# Patient Record
Sex: Male | Born: 1980 | Race: Black or African American | Hispanic: No | Marital: Single | Smoking: Current every day smoker
Health system: Southern US, Community
[De-identification: ages and names within clinical notes are randomized; demographics above are authoritative.]

## PROBLEM LIST (undated history)

## (undated) DIAGNOSIS — Z8619 Personal history of other infectious and parasitic diseases: Secondary | ICD-10-CM

## (undated) HISTORY — DX: Personal history of other infectious and parasitic diseases: Z86.19

---
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
## 2000-08-03 ENCOUNTER — Emergency Department (HOSPITAL_COMMUNITY): Admission: EM | Admit: 2000-08-03 | Discharge: 2000-08-03 | Payer: Self-pay | Admitting: Emergency Medicine

## 2001-03-20 ENCOUNTER — Emergency Department (HOSPITAL_COMMUNITY): Admission: EM | Admit: 2001-03-20 | Discharge: 2001-03-20 | Payer: Self-pay | Admitting: Emergency Medicine

## 2002-07-03 ENCOUNTER — Emergency Department (HOSPITAL_COMMUNITY): Admission: EM | Admit: 2002-07-03 | Discharge: 2002-07-03 | Payer: Self-pay | Admitting: Emergency Medicine

## 2002-07-16 ENCOUNTER — Emergency Department (HOSPITAL_COMMUNITY): Admission: EM | Admit: 2002-07-16 | Discharge: 2002-07-16 | Payer: Self-pay | Admitting: Emergency Medicine

## 2002-07-20 ENCOUNTER — Emergency Department (HOSPITAL_COMMUNITY): Admission: EM | Admit: 2002-07-20 | Discharge: 2002-07-20 | Payer: Self-pay | Admitting: *Deleted

## 2004-07-17 ENCOUNTER — Emergency Department (HOSPITAL_COMMUNITY): Admission: EM | Admit: 2004-07-17 | Discharge: 2004-07-17 | Payer: Self-pay | Admitting: Emergency Medicine

## 2008-05-10 ENCOUNTER — Emergency Department (HOSPITAL_COMMUNITY): Admission: EM | Admit: 2008-05-10 | Discharge: 2008-05-10 | Payer: Self-pay | Admitting: Emergency Medicine

## 2009-03-10 ENCOUNTER — Emergency Department (HOSPITAL_COMMUNITY): Admission: EM | Admit: 2009-03-10 | Discharge: 2009-03-10 | Payer: Self-pay | Admitting: Emergency Medicine

## 2009-07-19 ENCOUNTER — Encounter (INDEPENDENT_AMBULATORY_CARE_PROVIDER_SITE_OTHER): Payer: Self-pay | Admitting: *Deleted

## 2009-08-02 ENCOUNTER — Ambulatory Visit: Payer: Self-pay | Admitting: Internal Medicine

## 2009-08-02 DIAGNOSIS — M545 Low back pain: Secondary | ICD-10-CM

## 2009-08-02 DIAGNOSIS — N644 Mastodynia: Secondary | ICD-10-CM

## 2009-08-02 DIAGNOSIS — B350 Tinea barbae and tinea capitis: Secondary | ICD-10-CM

## 2009-08-07 LAB — CONVERTED CEMR LAB
ALT: 23 units/L (ref 0–53)
AST: 23 units/L (ref 0–37)
Albumin: 3.8 g/dL (ref 3.5–5.2)
Alkaline Phosphatase: 83 units/L (ref 39–117)
BUN: 12 mg/dL (ref 6–23)
Basophils Absolute: 0 10*3/uL (ref 0.0–0.1)
Basophils Relative: 0.1 % (ref 0.0–3.0)
Bilirubin, Direct: 0.2 mg/dL (ref 0.0–0.3)
CO2: 28 meq/L (ref 19–32)
Calcium: 9.3 mg/dL (ref 8.4–10.5)
Chloride: 104 meq/L (ref 96–112)
Creatinine, Ser: 1 mg/dL (ref 0.4–1.5)
Eosinophils Absolute: 0.2 10*3/uL (ref 0.0–0.7)
Eosinophils Relative: 2.9 % (ref 0.0–5.0)
GFR calc non Af Amer: 113.86 mL/min (ref >60)
Glucose, Bld: 71 mg/dL (ref 70–99)
HCT: 45 % (ref 39.0–52.0)
Hemoglobin: 15 g/dL (ref 13.0–17.0)
Lymphocytes Relative: 26.8 % (ref 12.0–46.0)
Lymphs Abs: 1.9 10*3/uL (ref 0.7–4.0)
MCHC: 33.4 g/dL (ref 30.0–36.0)
MCV: 89.1 fL (ref 78.0–100.0)
Monocytes Absolute: 0.7 10*3/uL (ref 0.1–1.0)
Monocytes Relative: 10.3 % (ref 3.0–12.0)
Neutro Abs: 4.4 10*3/uL (ref 1.4–7.7)
Neutrophils Relative %: 59.9 % (ref 43.0–77.0)
Platelets: 257 10*3/uL (ref 150.0–400.0)
Potassium: 4.1 meq/L (ref 3.5–5.1)
RBC: 5.05 M/uL (ref 4.22–5.81)
RDW: 12.6 % (ref 11.5–14.6)
Sodium: 138 meq/L (ref 135–145)
TSH: 1.62 microintl units/mL (ref 0.35–5.50)
Total Bilirubin: 1.5 mg/dL — ABNORMAL HIGH (ref 0.3–1.2)
Total Protein: 7.9 g/dL (ref 6.0–8.3)
WBC: 7.2 10*3/uL (ref 4.5–10.5)

## 2009-08-08 ENCOUNTER — Encounter (INDEPENDENT_AMBULATORY_CARE_PROVIDER_SITE_OTHER): Payer: Self-pay | Admitting: *Deleted

## 2010-04-12 ENCOUNTER — Ambulatory Visit: Payer: Self-pay | Admitting: Diagnostic Radiology

## 2010-04-12 ENCOUNTER — Emergency Department (HOSPITAL_BASED_OUTPATIENT_CLINIC_OR_DEPARTMENT_OTHER): Admission: EM | Admit: 2010-04-12 | Discharge: 2010-04-12 | Payer: Self-pay | Admitting: Emergency Medicine

## 2011-03-05 ENCOUNTER — Emergency Department (HOSPITAL_COMMUNITY)
Admission: EM | Admit: 2011-03-05 | Discharge: 2011-03-06 | Disposition: A | Discharge status: Home / Self Care | Payer: Self-pay | Attending: Emergency Medicine | Admitting: Emergency Medicine

## 2011-03-05 DIAGNOSIS — R079 Chest pain, unspecified: Secondary | ICD-10-CM | POA: Insufficient documentation

## 2011-03-05 DIAGNOSIS — E860 Dehydration: Secondary | ICD-10-CM | POA: Insufficient documentation

## 2011-03-05 DIAGNOSIS — R1110 Vomiting, unspecified: Secondary | ICD-10-CM | POA: Insufficient documentation

## 2011-03-06 ENCOUNTER — Emergency Department (HOSPITAL_COMMUNITY): Payer: Self-pay

## 2011-03-06 LAB — POCT I-STAT, CHEM 8
BUN: 5 mg/dL — ABNORMAL LOW (ref 6–23)
Calcium, Ion: 1.16 mmol/L (ref 1.12–1.32)
Chloride: 103 mEq/L (ref 96–112)
Creatinine, Ser: 1.3 mg/dL (ref 0.4–1.5)
Glucose, Bld: 108 mg/dL — ABNORMAL HIGH (ref 70–99)
HCT: 49 % (ref 39.0–52.0)
Hemoglobin: 16.7 g/dL (ref 13.0–17.0)
Potassium: 3.3 mEq/L — ABNORMAL LOW (ref 3.5–5.1)
Sodium: 143 mEq/L (ref 135–145)
TCO2: 27 mmol/L (ref 0–100)

## 2011-10-19 ENCOUNTER — Ambulatory Visit (INDEPENDENT_AMBULATORY_CARE_PROVIDER_SITE_OTHER)
Admission: RE | Admit: 2011-10-19 | Discharge: 2011-10-19 | Disposition: A | Discharge status: Home / Self Care | Payer: Managed Care, Other (non HMO) | Source: Ambulatory Visit | Attending: Internal Medicine | Admitting: Internal Medicine

## 2011-10-19 ENCOUNTER — Ambulatory Visit (INDEPENDENT_AMBULATORY_CARE_PROVIDER_SITE_OTHER): Payer: Managed Care, Other (non HMO) | Admitting: Internal Medicine

## 2011-10-19 ENCOUNTER — Encounter: Payer: Self-pay | Admitting: Internal Medicine

## 2011-10-19 DIAGNOSIS — M545 Low back pain, unspecified: Secondary | ICD-10-CM

## 2011-10-19 DIAGNOSIS — M722 Plantar fascial fibromatosis: Secondary | ICD-10-CM | POA: Insufficient documentation

## 2011-10-19 MED ORDER — NAPROXEN 500 MG PO TABS: 500.0000 mg | ORAL_TABLET | Freq: Two times a day (BID) | ORAL | Status: AC

## 2011-10-19 NOTE — Assessment & Plan Note (Signed)
Start PT and try nsaids as well as pt ed material

## 2011-10-19 NOTE — Assessment & Plan Note (Addendum)
I will check a plain film today to look for any deformities, also I have asked him to start nsaids for pain relief and to start PT as well

## 2011-10-19 NOTE — Patient Instructions (Signed)
Plantar Fasciitis Plantar fasciitis is a common condition that causes foot pain. It is soreness (inflammation) of the band of tough fibrous tissue on the bottom of the foot that runs from the heel bone (calcaneus) to the ball of the foot. The cause of this soreness may be from excessive standing, poor fitting shoes, running on hard surfaces, being overweight, having an abnormal walk, or overuse (this is common in runners) of the painful foot or feet. It is also common in aerobic exercise dancers and ballet dancers. SYMPTOMS  Most people with plantar fasciitis complain of:  Severe pain in the morning on the bottom of their foot especially when taking the first steps out of bed. This pain recedes after a few minutes of walking.   Severe pain is experienced also during walking following a long period of inactivity.   Pain is worse when walking barefoot or up stairs  DIAGNOSIS   Your caregiver will diagnose this condition by examining and feeling your foot.   Special tests such as X-rays of your foot, are usually not needed.  PREVENTION   Consult a sports medicine professional before beginning a new exercise program.   Walking programs offer a good workout. With walking there is a lower chance of overuse injuries common to runners. There is less impact and less jarring of the joints.   Begin all new exercise programs slowly. If problems or pain develop, decrease the amount of time or distance until you are at a comfortable level.   Wear good shoes and replace them regularly.   Stretch your foot and the heel cords at the back of the ankle (Achilles tendon) both before and after exercise.   Run or exercise on even surfaces that are not hard. For example, asphalt is better than pavement.   Do not run barefoot on hard surfaces.   If using a treadmill, vary the incline.   Do not continue to workout if you have foot or joint problems. Seek professional help if they do not improve.  HOME CARE  INSTRUCTIONS   Avoid activities that cause you pain until you recover.   Use ice or cold packs on the problem or painful areas after working out.   Only take over-the-counter or prescription medicines for pain, discomfort, or fever as directed by your caregiver.   Soft shoe inserts or athletic shoes with air or gel sole cushions may be helpful.   If problems continue or become more severe, consult a sports medicine caregiver or your own health care provider. Cortisone is a potent anti-inflammatory medication that may be injected into the painful area. You can discuss this treatment with your caregiver.  MAKE SURE YOU:   Understand these instructions.   Will watch your condition.   Will get help right away if you are not doing well or get worse.  Document Released: 07/31/2001 Document Revised: 07/18/2011 Document Reviewed: 09/29/2008 [facility] Patient Information 2012 [place], [place].Back Pain, Adult Low back pain is very common. About 1 in 5 people have back pain.The cause of low back pain is rarely dangerous. The pain often gets better over time.About half of people with a sudden onset of back pain feel better in just 2 weeks. About 8 in 10 people feel better by 6 weeks.  CAUSES Some common causes of back pain include:  Strain of the muscles or ligaments supporting the spine.   Wear and tear (degeneration) of the spinal discs.   Arthritis.   Direct injury to the back.  DIAGNOSIS Most of the time, the direct cause of low back pain is not known.However, back pain can be treated effectively even when the exact cause of the pain is unknown.Answering your caregiver's questions about your overall health and symptoms is one of the most accurate ways to make sure the cause of your pain is not dangerous. If your caregiver needs more information, he or she may order lab work or imaging tests (X-rays or MRIs).However, even if imaging tests show changes in your back, this usually does not  require surgery. HOME CARE INSTRUCTIONS For many people, back pain returns.Since low back pain is rarely dangerous, it is often a condition that people can learn to [facility] their own.   Remain active. It is stressful on the back to sit or stand in one place. Do not sit, drive, or stand in one place for more than 30 minutes at a time. Take short walks on level surfaces as soon as pain allows.Try to increase the length of time you walk each day.   Do not stay in bed.Resting more than 1 or 2 days can delay your recovery.   Do not avoid exercise or work.Your body is made to move.It is not dangerous to be active, even though your back may hurt.Your back will likely heal faster if you return to being active before your pain is gone.   Pay attention to your body when you bend and lift. Many people have less discomfortwhen lifting if they bend their knees, keep the load close to their bodies,and avoid twisting. Often, the most comfortable positions are those that put less stress on your recovering back.   Find a comfortable position to sleep. Use a firm mattress and lie on your side with your knees slightly bent. If you lie on your back, put a pillow under your knees.   Only take over-the-counter or prescription medicines as directed by your caregiver. Over-the-counter medicines to reduce pain and inflammation are often the most helpful.Your caregiver may prescribe muscle relaxant drugs.These medicines help dull your pain so you can more quickly return to your normal activities and healthy exercise.   Put ice on the injured area.   Put ice in a plastic bag.   Place a towel between your skin and the bag.   Leave the ice on for 15 to 20 minutes, 3 to 4 times a day for the first 2 to 3 days. After that, ice and heat may be alternated to reduce pain and spasms.   Ask your caregiver about trying back exercises and gentle massage. This may be of some benefit.   Avoid feeling anxious or  stressed.Stress increases muscle tension and can worsen back pain.It is important to recognize when you are anxious or stressed and learn ways to manage it.Exercise is a great option.  SEEK MEDICAL CARE IF:  You have pain that is not relieved with rest or medicine.   You have pain that does not improve in 1 week.   You have new symptoms.   You are generally not feeling well.  SEEK IMMEDIATE MEDICAL CARE IF:   You have pain that radiates from your back into your legs.   You develop new bowel or bladder control problems.   You have unusual weakness or numbness in your arms or legs.   You develop nausea or vomiting.   You develop abdominal pain.   You feel faint.  Document Released: 11/05/2005 Document Revised: 07/18/2011 Document Reviewed: 03/26/2011 [facility] Patient Information 2012 [place],  [employer]. 

## 2011-10-19 NOTE — Progress Notes (Signed)
Subjective:    Patient ID: Jesus Freeman, male    DOB: 04/14/1981, 30 y.o.   MRN: [id number]  Back Pain This is a chronic problem. The current episode started more than 1 year ago. The problem occurs intermittently. The problem is unchanged. The pain is present in the lumbar spine. The quality of the pain is described as aching. The pain does not radiate. The pain is at a severity of 2/10. The pain is mild. The pain is worse during the day. The symptoms are aggravated by bending and twisting. Stiffness is present in the morning. Pertinent negatives include no abdominal pain, bladder incontinence, bowel incontinence, chest pain, dysuria, fever, headaches, leg pain, numbness, paresis, paresthesias, pelvic pain, perianal numbness, tingling, weakness or weight loss. Risk factors include lack of exercise. He has tried nothing for the symptoms.      Review of Systems  Constitutional: Negative for fever, chills, weight loss, diaphoresis, activity change, appetite change, fatigue and unexpected weight change.  HENT: Negative.   Eyes: Negative.   Respiratory: Negative for cough, shortness of breath, wheezing and stridor.   Cardiovascular: Negative for chest pain, palpitations and leg swelling.  Gastrointestinal: Negative for abdominal pain and bowel incontinence.  Genitourinary: Negative for bladder incontinence, dysuria, urgency, frequency, hematuria, flank pain, decreased urine volume, enuresis, difficulty urinating and pelvic pain.  Musculoskeletal: Positive for back pain and arthralgias (bilateral foot pain). Negative for myalgias, joint swelling and gait problem.  Skin: Negative for color change, pallor, rash and wound.  Neurological: Negative for dizziness, tingling, tremors, seizures, syncope, facial asymmetry, speech difficulty, weakness, light-headedness, numbness, headaches and paresthesias.  Hematological: Negative for adenopathy. Does not bruise/bleed easily.  Psychiatric/Behavioral:  Negative.        Objective:   Physical Exam  Vitals reviewed. Constitutional: He appears well-developed. No distress.  HENT:  Head: Normocephalic and atraumatic.  Mouth/Throat: Oropharynx is clear and moist. No oropharyngeal exudate.  Eyes: Conjunctivae are normal. Right eye exhibits no discharge. Left eye exhibits no discharge. No scleral icterus.  Neck: Normal range of motion. Neck supple. No JVD present. No tracheal deviation present. No thyromegaly present.  Cardiovascular: Normal rate, regular rhythm, normal heart sounds and intact distal pulses.  Exam reveals no gallop and no friction rub.   No murmur heard. Pulmonary/Chest: Effort normal and breath sounds normal. No stridor. No respiratory distress. He has no wheezes. He has no rales. He exhibits no tenderness.  Abdominal: Soft. Bowel sounds are normal. He exhibits no distension and no mass. There is no tenderness. There is no rebound and no guarding.  Musculoskeletal: Normal range of motion.       Lumbar back: He exhibits normal range of motion, no tenderness, no bony tenderness, no swelling, no edema, no deformity, no laceration, no pain and no spasm.       Right foot: He exhibits deformity (severe flat footedness). He exhibits normal range of motion, no tenderness, no bony tenderness, no swelling, normal capillary refill, no crepitus and no laceration.       Left foot: He exhibits deformity (severe flat footedness). He exhibits normal range of motion, no tenderness, no bony tenderness, no swelling, normal capillary refill, no crepitus and no laceration.       - SLR in both legs  Lymphadenopathy:    He has no cervical adenopathy.  Neurological: He is alert. He has normal strength. He displays no atrophy, no tremor and normal reflexes. No cranial nerve deficit or sensory deficit. He exhibits normal muscle tone. He  displays a negative Romberg sign. Coordination and gait normal. He displays no Babinski's sign on the right side. He  displays no Babinski's sign on the left side.  Reflex Scores:      Tricep reflexes are 1+ on the right side and 1+ on the left side.      Bicep reflexes are 1+ on the right side and 1+ on the left side.      Brachioradialis reflexes are 1+ on the right side and 1+ on the left side.      Patellar reflexes are 1+ on the right side and 1+ on the left side.      Achilles reflexes are 1+ on the left side. Skin: Skin is warm and dry. No rash noted. He is not diaphoretic. No erythema. No pallor.  Psychiatric: He has a normal mood and affect. His behavior is normal. Judgment and thought content normal.          Assessment & Plan:

## 2012-03-19 IMAGING — CR DG CHEST 2V
2 series · 2 of 2 positions shown · non-contrast
Comparison: None.

CLINICAL DATA: Vomiting and mid chest pain.

CHEST - 2 VIEW

[w chest pa]
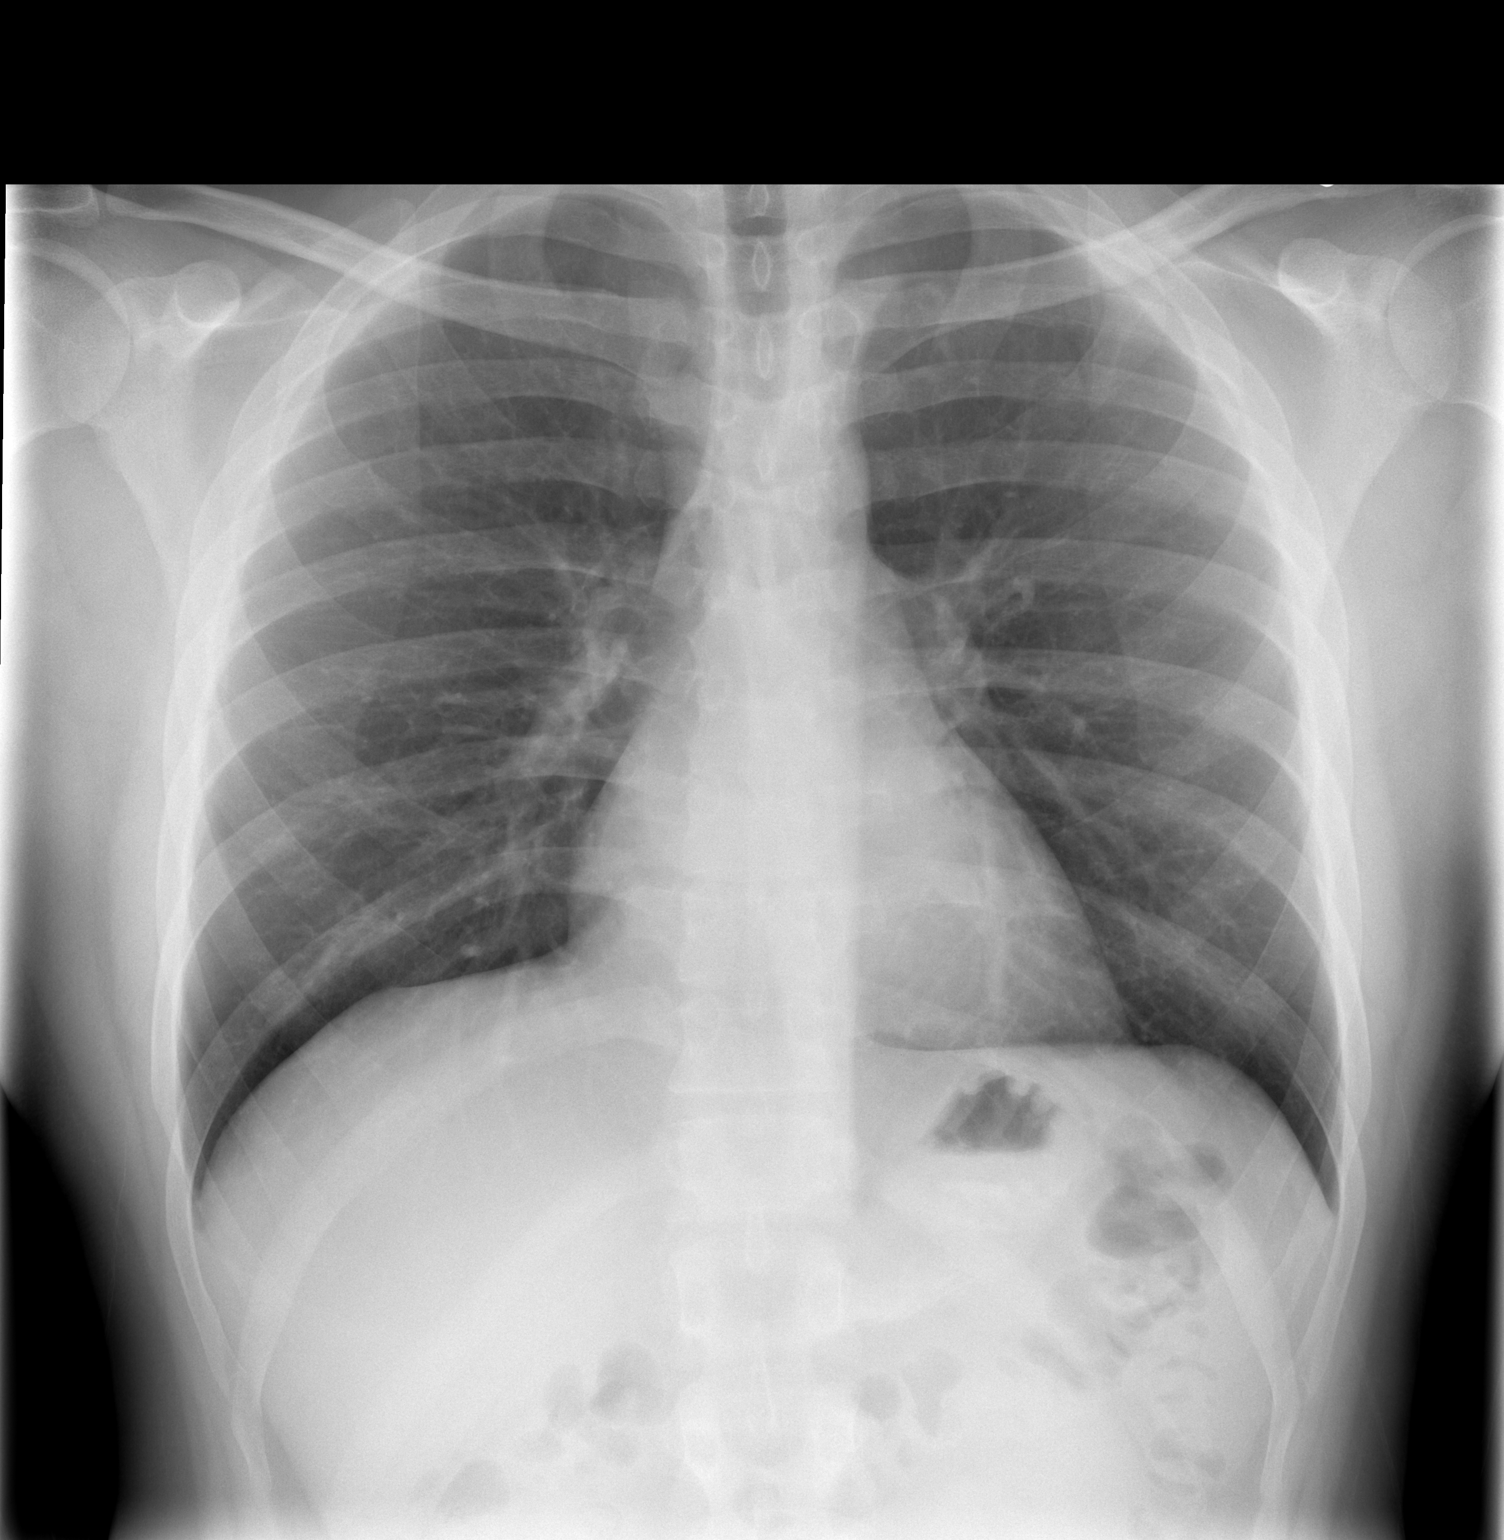

[w chest lat]
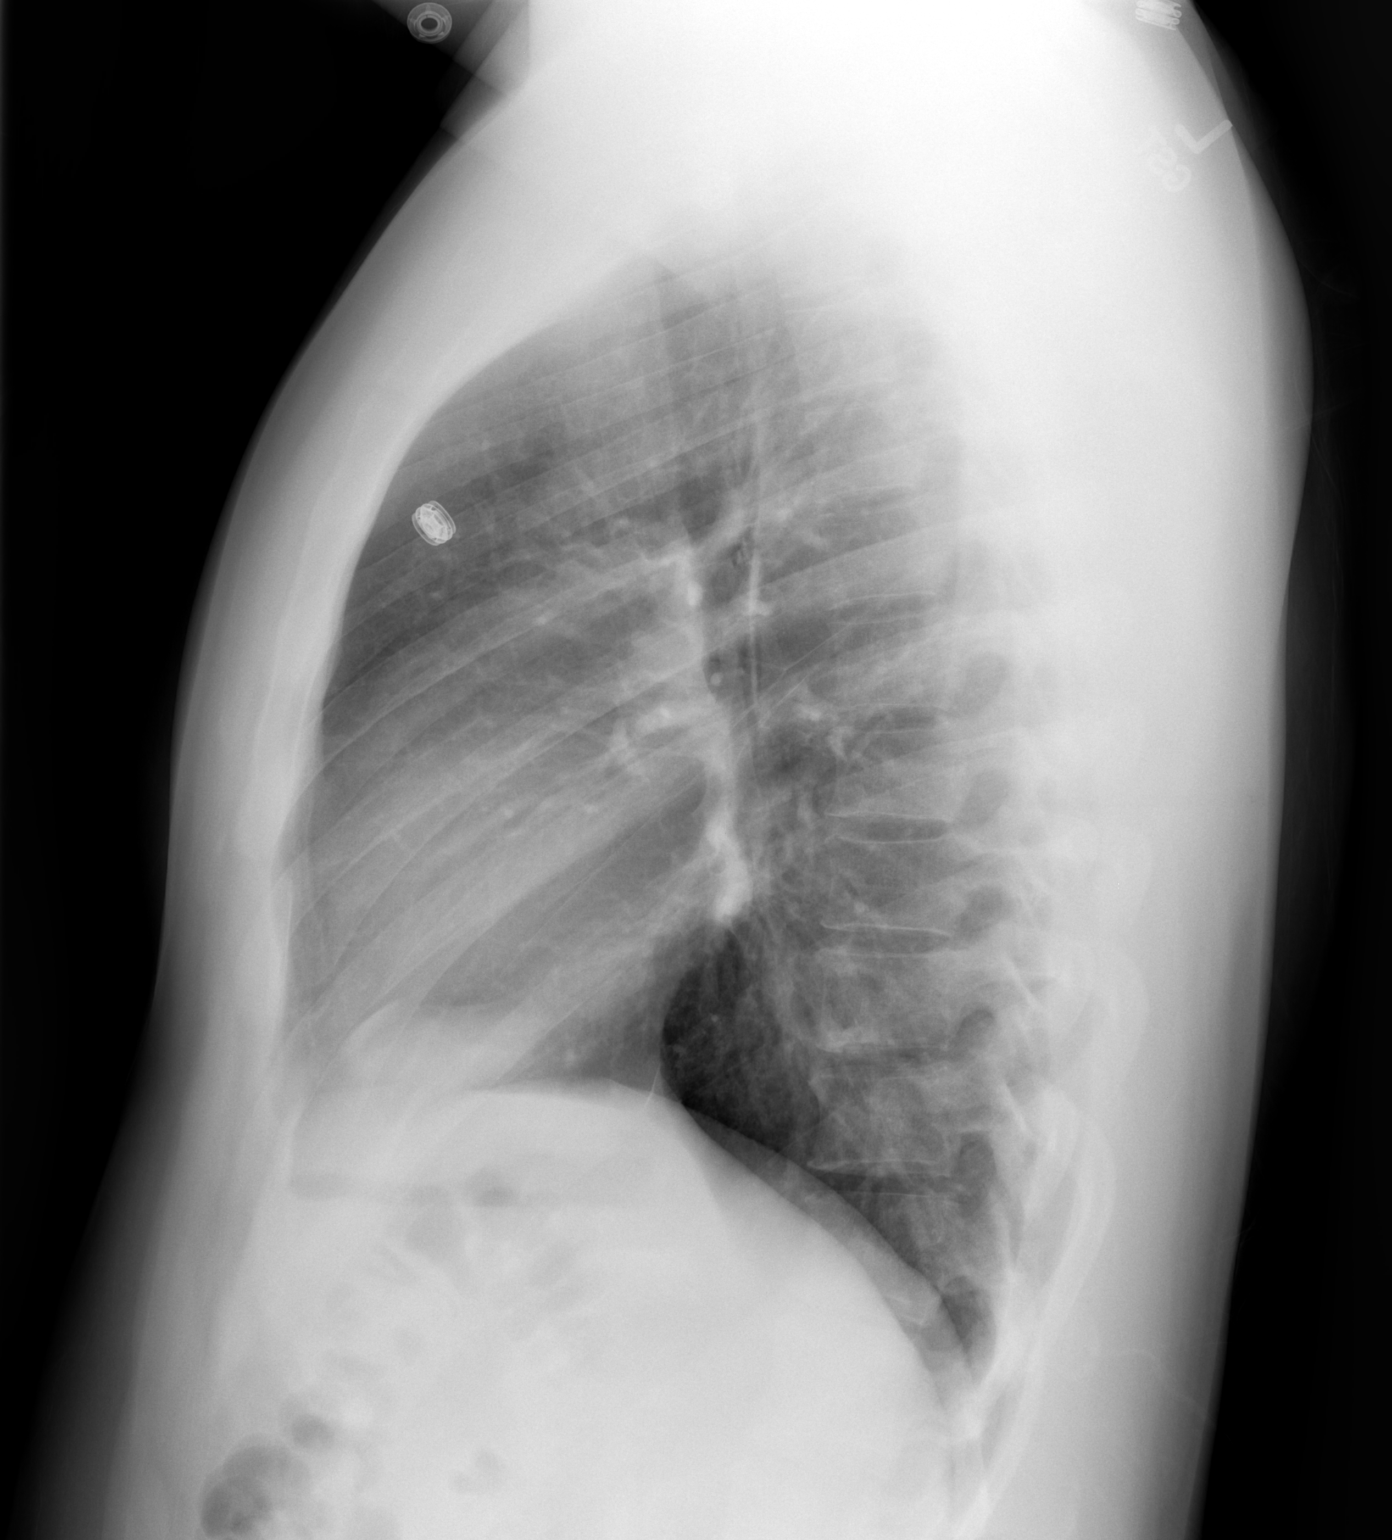

[2 of 2 positions shown; findings below may reference images not displayed]

FINDINGS: The heart size and pulmonary vascularity are normal. The
lungs appear clear and expanded without focal air space disease or
consolidation. No blunting of the costophrenic angles.
IMPRESSION: No evidence of active pulmonary disease.

## 2014-06-29 ENCOUNTER — Emergency Department (HOSPITAL_COMMUNITY): Payer: Worker's Compensation

## 2014-06-29 ENCOUNTER — Emergency Department (HOSPITAL_COMMUNITY)
Admission: EM | Admit: 2014-06-29 | Discharge: 2014-06-29 | Disposition: A | Discharge status: Home / Self Care | Payer: Worker's Compensation | Attending: Emergency Medicine | Admitting: Emergency Medicine

## 2014-06-29 ENCOUNTER — Encounter (HOSPITAL_COMMUNITY): Payer: Self-pay | Admitting: Emergency Medicine

## 2014-06-29 DIAGNOSIS — X503XXA Overexertion from repetitive movements, initial encounter: Secondary | ICD-10-CM | POA: Insufficient documentation

## 2014-06-29 DIAGNOSIS — Y9289 Other specified places as the place of occurrence of the external cause: Secondary | ICD-10-CM | POA: Insufficient documentation

## 2014-06-29 DIAGNOSIS — S99919A Unspecified injury of unspecified ankle, initial encounter: Secondary | ICD-10-CM

## 2014-06-29 DIAGNOSIS — S82402A Unspecified fracture of shaft of left fibula, initial encounter for closed fracture: Secondary | ICD-10-CM

## 2014-06-29 DIAGNOSIS — S8990XA Unspecified injury of unspecified lower leg, initial encounter: Secondary | ICD-10-CM | POA: Insufficient documentation

## 2014-06-29 DIAGNOSIS — Z8619 Personal history of other infectious and parasitic diseases: Secondary | ICD-10-CM | POA: Insufficient documentation

## 2014-06-29 DIAGNOSIS — S8263XA Displaced fracture of lateral malleolus of unspecified fibula, initial encounter for closed fracture: Secondary | ICD-10-CM | POA: Insufficient documentation

## 2014-06-29 DIAGNOSIS — Z791 Long term (current) use of non-steroidal anti-inflammatories (NSAID): Secondary | ICD-10-CM | POA: Diagnosis not present

## 2014-06-29 DIAGNOSIS — Y9389 Activity, other specified: Secondary | ICD-10-CM | POA: Diagnosis not present

## 2014-06-29 DIAGNOSIS — S99929A Unspecified injury of unspecified foot, initial encounter: Secondary | ICD-10-CM

## 2014-06-29 DIAGNOSIS — F17200 Nicotine dependence, unspecified, uncomplicated: Secondary | ICD-10-CM | POA: Diagnosis not present

## 2014-06-29 MED ORDER — HYDROCODONE-ACETAMINOPHEN 5-325 MG PO TABS: 1.0000 | ORAL_TABLET | ORAL | Status: DC | PRN

## 2014-06-29 NOTE — ED Notes (Signed)
Pt was at work stepping over 2x4s when he stepped down he twisted left ankle.  Pt walked into triage room.

## 2014-06-29 NOTE — Discharge Instructions (Signed)
Take vicodin as needed for pain. Follow up with recommended Orthopedist for further evaluation. Rest, ice, and elevate your ankle. Refer to attached documents for more information.

## 2014-06-29 NOTE — ED Provider Notes (Signed)
CSN: [id number]     Arrival date & time 06/29/14  1133 History   First MD Initiated Contact with Patient 06/29/14 1147     Chief Complaint  Patient presents with  . Ankle Injury     (Consider location/radiation/quality/duration/timing/severity/associated sxs/prior Treatment) HPI Comments: Patient is a 33 year old male who presents with left ankle pain that started last night. The mechanism of injury was sudden ankle inversion. Patient reports hearing a "pop" sudden onset of throbbing, severe pain that is localized to left ankle. Patient reports progressive worsening of pain. Ankle movement and weight bearing activity make the pain worse. Nothing makes the pain better. Patient reports associated swelling. Patient has not tried anything for pain relief. Patient denies obvious deformity, numbness/tingling, coolness/weakness of extremity, bruising, and any other injury.     Patient is a 33 y.o. male presenting with lower extremity injury.  Ankle Injury Associated symptoms include arthralgias and joint swelling. Pertinent negatives include no abdominal pain, chest pain, chills, fatigue, fever, nausea, neck pain, vomiting or weakness.    Past Medical History  Diagnosis Date  . History of chicken pox    History reviewed. No pertinent past surgical history. Family History  Problem Relation Age of Onset  . Asthma Other     FH of-Grandparent  . Cancer Other     Breast Cancer-Other Blood Relative  . Hypertension Other     Parent, Other Blood Relative  . Diabetes Other     Parent  . Hypertension Father    History  Substance Use Topics  . Smoking status: Current Every Day Smoker -- 0.30 packs/day for 13 years    Types: Cigarettes  . Smokeless tobacco: Never Used  . Alcohol Use: 1.2 oz/week    2 Shots of liquor per week    Review of Systems  Constitutional: Negative for fever, chills and fatigue.  HENT: Negative for trouble swallowing.   Eyes: Negative for visual disturbance.   Respiratory: Negative for shortness of breath.   Cardiovascular: Negative for chest pain and palpitations.  Gastrointestinal: Negative for nausea, vomiting, abdominal pain and diarrhea.  Genitourinary: Negative for dysuria and difficulty urinating.  Musculoskeletal: Positive for arthralgias and joint swelling. Negative for neck pain.  Skin: Negative for color change.  Neurological: Negative for dizziness and weakness.  Psychiatric/Behavioral: Negative for dysphoric mood.      Allergies  Review of patient's allergies indicates no known allergies.  Home Medications   Prior to Admission medications   Medication Sig Start Date End Date Taking? Authorizing Provider  ibuprofen (ADVIL,MOTRIN) 200 MG tablet Take 200 mg by mouth every 6 (six) hours as needed (pain.).   Yes Historical Provider, MD   BP 121/72  Pulse 83  Temp(Src) 98.2 F (36.8 C) (Oral)  Resp 18  SpO2 100% Physical Exam  Nursing note and vitals reviewed. Constitutional: He is oriented to person, place, and time. He appears well-developed and well-nourished. No distress.  HENT:  Head: Normocephalic and atraumatic.  Eyes: Conjunctivae are normal.  Neck: Normal range of motion.  Cardiovascular: Normal rate, regular rhythm and intact distal pulses.  Exam reveals no gallop and no friction rub.   No murmur heard. Pulmonary/Chest: Effort normal and breath sounds normal. He has no wheezes. He has no rales. He exhibits no tenderness.  Musculoskeletal:  Left lateral malleolar tenderness to palpation. Moderate edema of left lateral malleolus. Limited ROM due to pain of left ankle. No obvious deformity.   Neurological: He is alert and oriented to person, place,  and time. Coordination normal.  Sensation intact of left foot. Speech is goal-oriented. Moves limbs without ataxia.   Skin: Skin is warm and dry.  Psychiatric: He has a normal mood and affect. His behavior is normal.    ED Course  Procedures (including critical care  time) Labs Review Labs Reviewed - No data to display  SPLINT APPLICATION Date/Time: 12:54 PM Authorized by: Emilia Beck Consent: Verbal consent obtained. Risks and benefits: risks, benefits and alternatives were discussed Consent given by: patient Splint applied by: orthopedic technician Location details: left leg Splint type: CAM walker Supplies used: n/a Post-procedure: The splinted body part was neurovascularly unchanged following the procedure. Patient tolerance: Patient tolerated the procedure well with no immediate complications.     Imaging Review Dg Ankle Complete Left  06/29/2014   CLINICAL DATA:  Ankle injury  EXAM: LEFT ANKLE COMPLETE - 3+ VIEW  COMPARISON:  None.  FINDINGS: Three views of the left ankle submitted. There is minimal displaced oblique fracture of distal fibula. Significant lateral soft tissue swelling.  IMPRESSION: Minimal displaced oblique fracture in distal fibula. Significant lateral soft tissue swelling.   Electronically Signed   By: Natasha Mead M.D.   On: 06/29/2014 12:14     EKG Interpretation None      MDM   Final diagnoses:  Closed left fibular fracture, initial encounter    12:47 PM Patient's xrays shows minimally displaced oblique fracture of distal fibula. Patient will have CAM walker and orthopedic follow up. No neurovascular compromise. Patient declines pain medication at this time. No other injury.     Emilia Beck, PA-C 06/30/14 1819

## 2014-07-01 NOTE — ED Provider Notes (Signed)
Medical screening examination/treatment/procedure(s) were performed by non-physician practitioner and as supervising physician I was immediately available for consultation/collaboration.   EKG Interpretation None        Brennyn Ortlieb M Jeffey Janssen, MD 07/01/14 0706 

## 2015-03-26 ENCOUNTER — Encounter (HOSPITAL_COMMUNITY): Payer: Self-pay | Admitting: *Deleted

## 2015-03-26 ENCOUNTER — Emergency Department (HOSPITAL_COMMUNITY)
Admission: EM | Admit: 2015-03-26 | Discharge: 2015-03-26 | Disposition: A | Discharge status: Home / Self Care | Payer: Managed Care, Other (non HMO) | Attending: Emergency Medicine | Admitting: Emergency Medicine

## 2015-03-26 DIAGNOSIS — Z8619 Personal history of other infectious and parasitic diseases: Secondary | ICD-10-CM | POA: Insufficient documentation

## 2015-03-26 DIAGNOSIS — R1110 Vomiting, unspecified: Secondary | ICD-10-CM | POA: Insufficient documentation

## 2015-03-26 DIAGNOSIS — Z720 Tobacco use: Secondary | ICD-10-CM | POA: Insufficient documentation

## 2015-03-26 DIAGNOSIS — R109 Unspecified abdominal pain: Secondary | ICD-10-CM | POA: Diagnosis not present

## 2015-03-26 LAB — CBC WITH DIFFERENTIAL/PLATELET
BASOS PCT: 1 % (ref 0–1)
Basophils Absolute: 0.1 10*3/uL (ref 0.0–0.1)
EOS ABS: 0.2 10*3/uL (ref 0.0–0.7)
EOS PCT: 2 % (ref 0–5)
HEMATOCRIT: 44.4 % (ref 39.0–52.0)
Hemoglobin: 15 g/dL (ref 13.0–17.0)
Lymphocytes Relative: 32 % (ref 12–46)
Lymphs Abs: 3.6 10*3/uL (ref 0.7–4.0)
MCH: 28.6 pg (ref 26.0–34.0)
MCHC: 33.8 g/dL (ref 30.0–36.0)
MCV: 84.7 fL (ref 78.0–100.0)
MONO ABS: 0.9 10*3/uL (ref 0.1–1.0)
Monocytes Relative: 8 % (ref 3–12)
NEUTROS ABS: 6.4 10*3/uL (ref 1.7–7.7)
Neutrophils Relative %: 57 % (ref 43–77)
Platelets: 293 10*3/uL (ref 150–400)
RBC: 5.24 MIL/uL (ref 4.22–5.81)
RDW: 13.6 % (ref 11.5–15.5)
WBC: 11.2 10*3/uL — AB (ref 4.0–10.5)

## 2015-03-26 LAB — COMPREHENSIVE METABOLIC PANEL
ALBUMIN: 3.2 g/dL — AB (ref 3.5–5.0)
ALK PHOS: 82 U/L (ref 38–126)
ALT: 23 U/L (ref 17–63)
ANION GAP: 9 (ref 5–15)
AST: 20 U/L (ref 15–41)
BUN: 11 mg/dL (ref 6–20)
CO2: 24 mmol/L (ref 22–32)
Calcium: 8.9 mg/dL (ref 8.9–10.3)
Chloride: 103 mmol/L (ref 101–111)
Creatinine, Ser: 1.05 mg/dL (ref 0.61–1.24)
GFR calc Af Amer: >60 mL/min (ref >60)
GFR calc non Af Amer: >60 mL/min (ref >60)
Glucose, Bld: 94 mg/dL (ref 70–99)
POTASSIUM: 4 mmol/L (ref 3.5–5.1)
SODIUM: 136 mmol/L (ref 135–145)
TOTAL PROTEIN: 7.7 g/dL (ref 6.5–8.1)
Total Bilirubin: 0.9 mg/dL (ref 0.3–1.2)

## 2015-03-26 LAB — URINALYSIS, ROUTINE W REFLEX MICROSCOPIC
BILIRUBIN URINE: NEGATIVE
GLUCOSE, UA: NEGATIVE mg/dL
Hgb urine dipstick: NEGATIVE
Ketones, ur: NEGATIVE mg/dL
LEUKOCYTES UA: NEGATIVE
NITRITE: NEGATIVE
PH: 6 (ref 5.0–8.0)
Protein, ur: NEGATIVE mg/dL
SPECIFIC GRAVITY, URINE: 1.022 (ref 1.005–1.030)
Urobilinogen, UA: 1 mg/dL (ref 0.0–1.0)

## 2015-03-26 LAB — LIPASE, BLOOD: LIPASE: 24 U/L (ref 22–51)

## 2015-03-26 NOTE — ED Provider Notes (Signed)
CSN: [id number]     Arrival date & time 03/26/15  1625 History   First MD Initiated Contact with Patient 03/26/15 1803     Chief Complaint  Patient presents with  . Abdominal Pain     (Consider location/radiation/quality/duration/timing/severity/associated sxs/prior Treatment) HPI Comments: 34 year old male with no significant medical history, smoker, alcohol every few weeks presents with abdominal cramping constipation. Patient had seeds which were recalled for possible Listeria in 1 to make sure he was okay. No fevers or chills. No abdominal surgery. No currentvomiting or diarrhea. No blood in the stools. Mild diffuse cramping.all started since eating the seeds 2 days prior.  Patient is a 34 y.o. male presenting with abdominal pain. The history is provided by the patient.  Abdominal Pain Associated symptoms: vomiting   Associated symptoms: no chest pain, no chills, no diarrhea, no dysuria, no fever, no nausea and no shortness of breath     Past Medical History  Diagnosis Date  . History of chicken pox    History reviewed. No pertinent past surgical history. Family History  Problem Relation Age of Onset  . Asthma Other     FH of-Grandparent  . Cancer Other     Breast Cancer-Other Blood Relative  . Hypertension Other     Parent, Other Blood Relative  . Diabetes Other     Parent  . Hypertension Father    History  Substance Use Topics  . Smoking status: Current Every Day Smoker -- 0.30 packs/day for 13 years    Types: Cigarettes  . Smokeless tobacco: Never Used  . Alcohol Use: 1.2 oz/week    2 Shots of liquor per week    Review of Systems  Constitutional: Negative for fever and chills.  HENT: Negative for congestion.   Eyes: Negative for visual disturbance.  Respiratory: Negative for shortness of breath.   Cardiovascular: Negative for chest pain.  Gastrointestinal: Positive for vomiting and abdominal pain. Negative for nausea, diarrhea and blood in stool.   Genitourinary: Negative for dysuria and flank pain.  Musculoskeletal: Negative for back pain, neck pain and neck stiffness.  Skin: Negative for rash.  Neurological: Negative for light-headedness and headaches.      Allergies  Review of patient's allergies indicates no known allergies.  Home Medications   Prior to Admission medications   Medication Sig Start Date End Date Taking? Authorizing Provider  HYDROcodone-acetaminophen (NORCO/VICODIN) 5-325 MG per tablet Take 1-2 tablets by mouth every 4 (four) hours as needed for moderate pain or severe pain. Patient not taking: Reported on 03/26/2015 06/29/14   Kaitlyn Szekalski, PA-C   BP 139/75 mmHg  Pulse 92  Temp(Src) 98.5 F (36.9 C) (Oral)  Resp 18  SpO2 99% Physical Exam  Constitutional: He is oriented to person, place, and time. He appears well-developed and well-nourished.  HENT:  Head: Normocephalic and atraumatic.  Eyes: Conjunctivae are normal. Right eye exhibits no discharge. Left eye exhibits no discharge.  Neck: Normal range of motion. Neck supple. No tracheal deviation present.  Cardiovascular: Normal rate and regular rhythm.   Pulmonary/Chest: Effort normal and breath sounds normal.  Abdominal: Soft. He exhibits no distension. There is no tenderness. There is no guarding.  Musculoskeletal: He exhibits no edema.  Neurological: He is alert and oriented to person, place, and time.  Skin: Skin is warm. No rash noted.  Psychiatric: He has a normal mood and affect.  Nursing note and vitals reviewed.   ED Course  Procedures (including critical care time) Labs Review Labs Reviewed  CBC WITH DIFFERENTIAL/PLATELET - Abnormal; Notable for the following:    WBC 11.2 (*)    All other components within normal limits  COMPREHENSIVE METABOLIC PANEL - Abnormal; Notable for the following:    Albumin 3.2 (*)    All other components within normal limits  LIPASE, BLOOD  URINALYSIS, ROUTINE W REFLEX MICROSCOPIC    Imaging  Review No results found.   EKG Interpretation None      MDM   Final diagnoses:  Abdominal cramping   Well-appearing healthy male with mild abdominal cramping. No pain currently, no concerns at this time. Blood work reassuring.  Results and differential diagnosis were discussed with the patient/parent/guardian. Close follow up outpatient was discussed, comfortable with the plan.   Medications - No data to display  Filed Vitals:   03/26/15 1632 03/26/15 1824  BP: 137/85 139/75  Pulse: 108 92  Temp: 98.5 F (36.9 C)   TempSrc: Oral   Resp: 14 18  SpO2: 97% 99%    Final diagnoses:  Abdominal cramping        Jesus OharaJoshua Mee Macdonnell, MD 03/26/15 [phone]

## 2015-03-26 NOTE — ED Notes (Signed)
The pt is c/o abd pain for 2 days since he ate   ??? Contaminated sun flower seeds vomited once today no diarrhea

## 2015-03-26 NOTE — Discharge Instructions (Signed)
If you were given medicines take as directed.  If you are on coumadin or contraceptives realize their levels and effectiveness is altered by many different medicines.  If you have any reaction (rash, tongues swelling, other) to the medicines stop taking and see a physician.   Please follow up as directed and return to the ER or see a physician for new or worsening symptoms.  Thank you. Filed Vitals:   03/26/15 1632 03/26/15 1824  BP: 137/85 139/75  Pulse: 108 92  Temp: 98.5 F (36.9 C)   TempSrc: Oral   Resp: 14 18  SpO2: 97% 99%

## 2015-07-13 IMAGING — CR DG ANKLE COMPLETE 3+V*L*
3 series · 3 of 3 positions shown · non-contrast
Comparison: None.

CLINICAL DATA: Ankle injury

EXAM:
LEFT ANKLE COMPLETE - 3+ VIEW

[x ankle ap left]
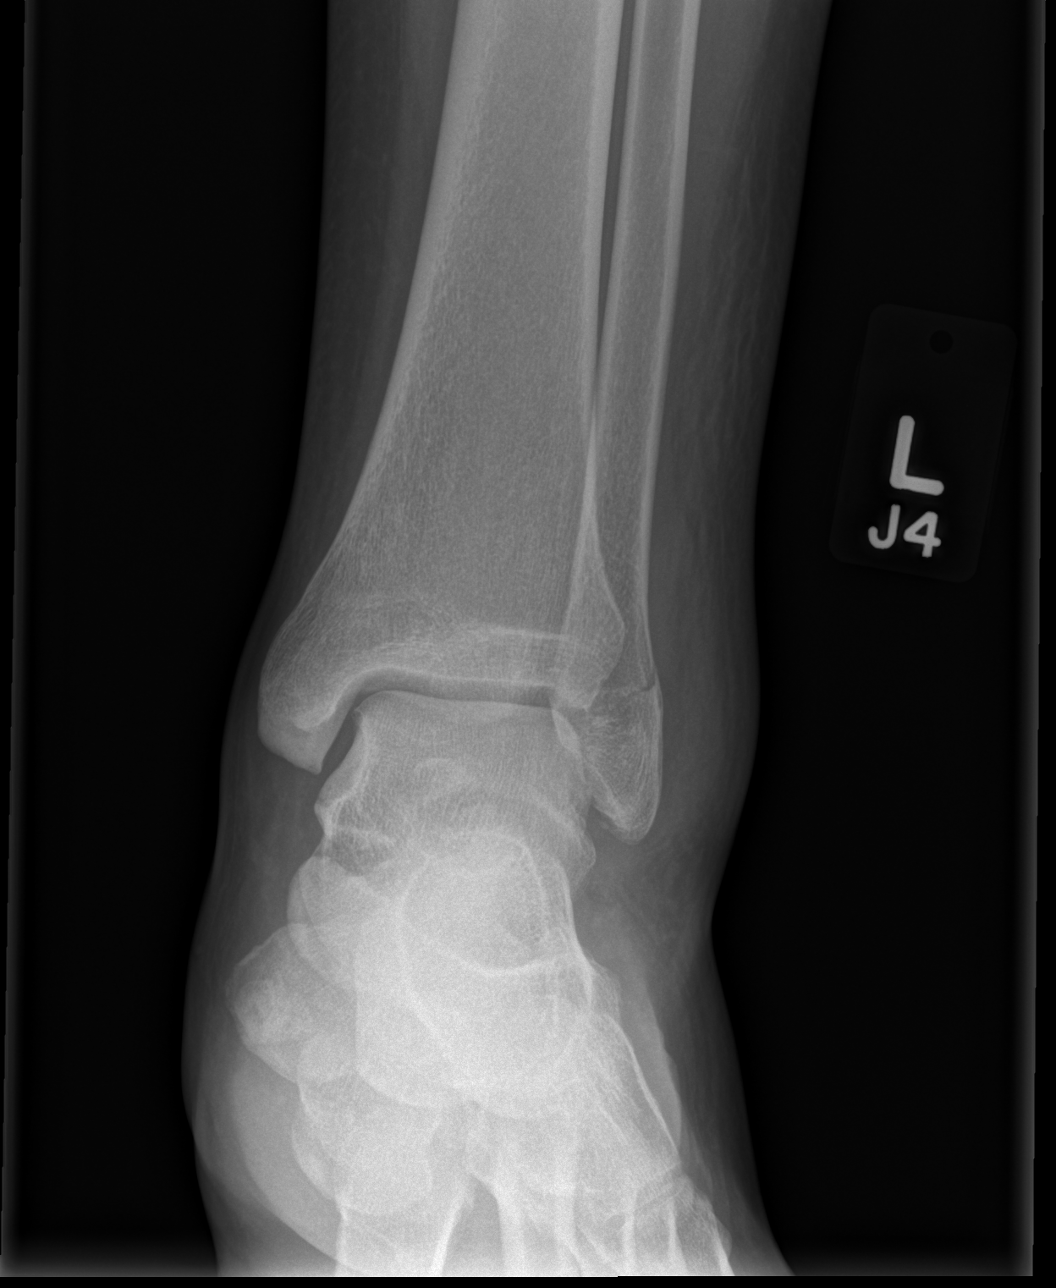

[x ankle obl left]
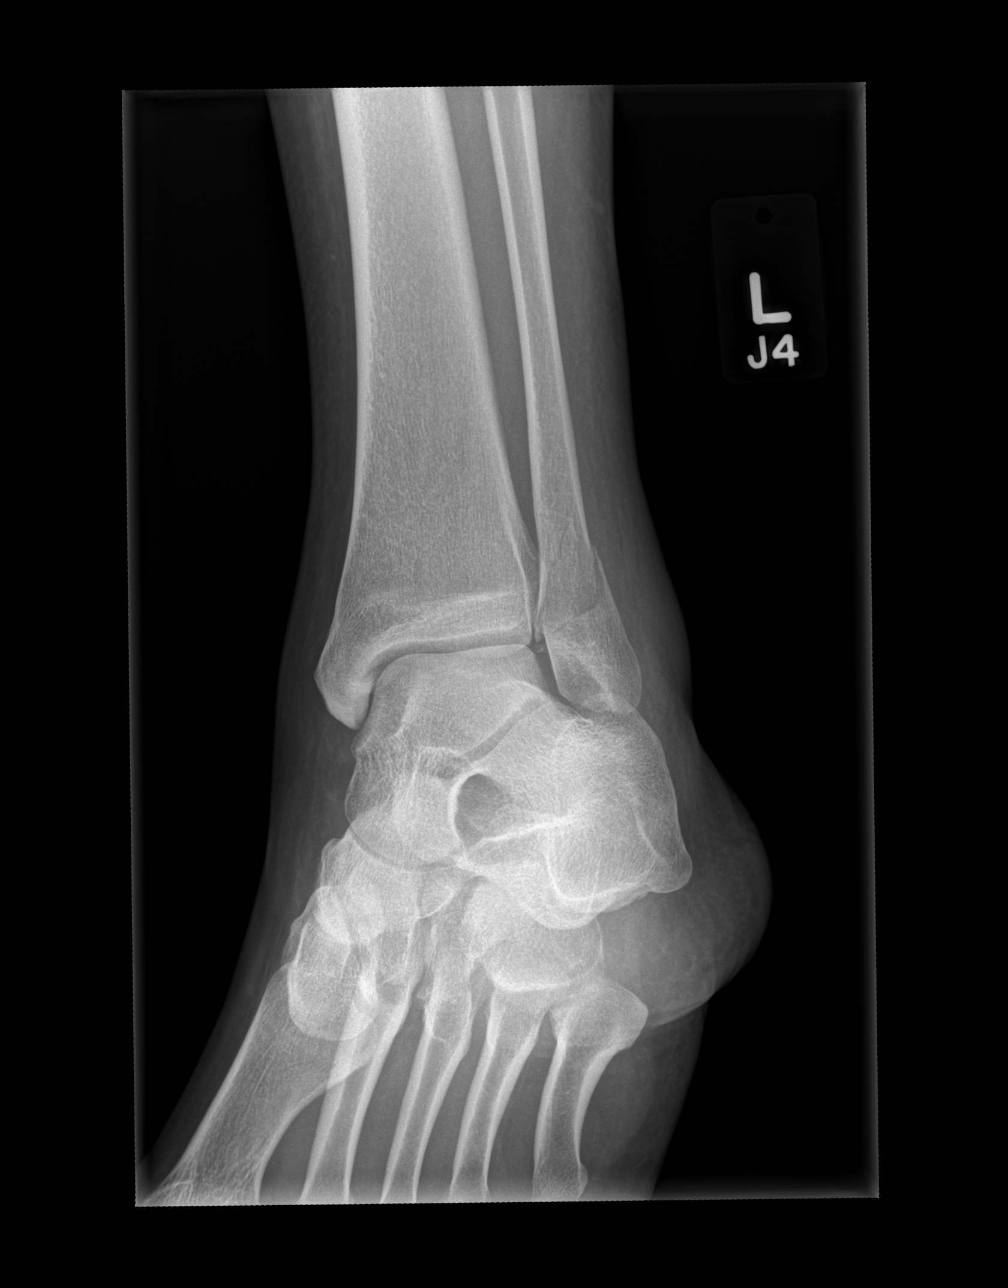

[x ankle lat left]
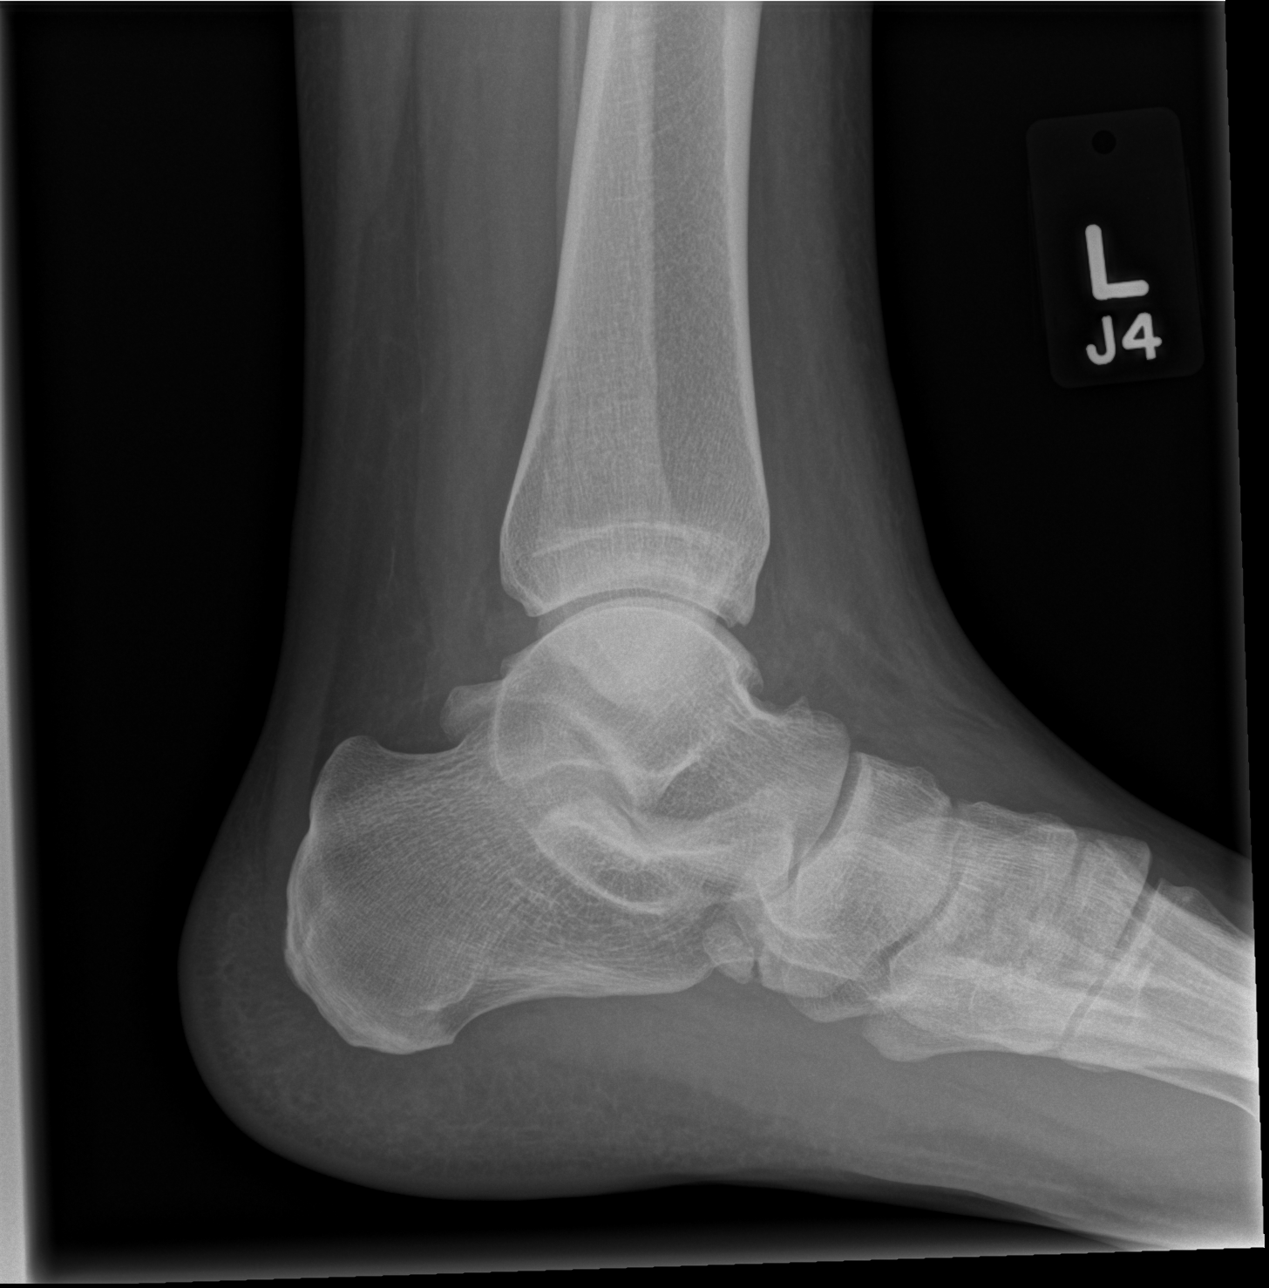

[3 of 3 positions shown; findings below may reference images not displayed]

FINDINGS: Three views of the left ankle submitted. There is minimal displaced
oblique fracture of distal fibula. Significant lateral soft tissue
swelling.
IMPRESSION: Minimal displaced oblique fracture in distal fibula. Significant
lateral soft tissue swelling.

## 2015-09-05 ENCOUNTER — Encounter (HOSPITAL_COMMUNITY): Payer: Self-pay | Admitting: Emergency Medicine

## 2015-09-05 DIAGNOSIS — Z720 Tobacco use: Secondary | ICD-10-CM | POA: Diagnosis not present

## 2015-09-05 DIAGNOSIS — R1032 Left lower quadrant pain: Secondary | ICD-10-CM | POA: Diagnosis present

## 2015-09-05 DIAGNOSIS — Z8619 Personal history of other infectious and parasitic diseases: Secondary | ICD-10-CM | POA: Insufficient documentation

## 2015-09-05 LAB — COMPREHENSIVE METABOLIC PANEL
ALBUMIN: 3.5 g/dL (ref 3.5–5.0)
ALT: 16 U/L — ABNORMAL LOW (ref 17–63)
ANION GAP: 7 (ref 5–15)
AST: 22 U/L (ref 15–41)
Alkaline Phosphatase: 68 U/L (ref 38–126)
BILIRUBIN TOTAL: 0.3 mg/dL (ref 0.3–1.2)
BUN: 11 mg/dL (ref 6–20)
CHLORIDE: 99 mmol/L — AB (ref 101–111)
CO2: 25 mmol/L (ref 22–32)
Calcium: 8.5 mg/dL — ABNORMAL LOW (ref 8.9–10.3)
Creatinine, Ser: 1.14 mg/dL (ref 0.61–1.24)
GFR calc Af Amer: >60 mL/min (ref >60)
GFR calc non Af Amer: >60 mL/min (ref >60)
GLUCOSE: 111 mg/dL — AB (ref 65–99)
POTASSIUM: 3.7 mmol/L (ref 3.5–5.1)
SODIUM: 131 mmol/L — AB (ref 135–145)
TOTAL PROTEIN: 6.9 g/dL (ref 6.5–8.1)

## 2015-09-05 LAB — URINALYSIS, ROUTINE W REFLEX MICROSCOPIC
BILIRUBIN URINE: NEGATIVE
Glucose, UA: NEGATIVE mg/dL
HGB URINE DIPSTICK: NEGATIVE
Ketones, ur: NEGATIVE mg/dL
Leukocytes, UA: NEGATIVE
Nitrite: NEGATIVE
PH: 6.5 (ref 5.0–8.0)
Protein, ur: NEGATIVE mg/dL
SPECIFIC GRAVITY, URINE: 1.024 (ref 1.005–1.030)
UROBILINOGEN UA: 1 mg/dL (ref 0.0–1.0)

## 2015-09-05 LAB — LIPASE, BLOOD: Lipase: 26 U/L (ref 22–51)

## 2015-09-05 LAB — CBC
HEMATOCRIT: 41.1 % (ref 39.0–52.0)
HEMOGLOBIN: 13.9 g/dL (ref 13.0–17.0)
MCH: 29.1 pg (ref 26.0–34.0)
MCHC: 33.8 g/dL (ref 30.0–36.0)
MCV: 86 fL (ref 78.0–100.0)
Platelets: 270 10*3/uL (ref 150–400)
RBC: 4.78 MIL/uL (ref 4.22–5.81)
RDW: 13.3 % (ref 11.5–15.5)
WBC: 8.7 10*3/uL (ref 4.0–10.5)

## 2015-09-05 NOTE — ED Notes (Signed)
Pt. reports intermittent mid abdominal pain onset last Thursday , denies nausea or vomitting , no diarrhea or fever . Last BM today .

## 2015-09-06 ENCOUNTER — Emergency Department (HOSPITAL_COMMUNITY)
Admission: EM | Admit: 2015-09-06 | Discharge: 2015-09-06 | Disposition: A | Discharge status: Home / Self Care | Payer: Managed Care, Other (non HMO) | Attending: Emergency Medicine | Admitting: Emergency Medicine

## 2015-09-06 DIAGNOSIS — R109 Unspecified abdominal pain: Secondary | ICD-10-CM

## 2015-09-06 NOTE — Discharge Instructions (Signed)

## 2015-09-06 NOTE — ED Provider Notes (Signed)
CSN: [id number]     Arrival date & time 09/05/15  2234 History  By signing my name below, I, Jesus Freeman, attest that this documentation has been prepared under the direction and in the presence of Azalia BilisKevin Toshiro Hanken, MD. Electronically Signed: Placido SouLogan Freeman, ED Scribe. 09/06/2015. 3:03 AM.   Chief Complaint  Patient presents with  . Abdominal Pain   The history is provided by the patient. No language interpreter was used.    HPI Comments: Jesus Freeman is a 34 y.o. male who presents to the Emergency Department complaining of intermittent, moderate, LLQ, abd pain with onset last Thursday. He notes that it began last week alleviated over the weekend and then began again today while at work and is now mostly alleviated. Pt describes his pain as a "gassy" feeling. He notes some associated, mild, lightheadedness due to the pain. Pt denies any other known medical issues. He denies n/v/d, fever, urinary issues or constipation.   Past Medical History  Diagnosis Date  . History of chicken pox    History reviewed. No pertinent past surgical history. Family History  Problem Relation Age of Onset  . Asthma Other     FH of-Grandparent  . Cancer Other     Breast Cancer-Other Blood Relative  . Hypertension Other     Parent, Other Blood Relative  . Diabetes Other     Parent  . Hypertension Father    Social History  Substance Use Topics  . Smoking status: Current Every Day Smoker -- 0.30 packs/day for 13 years    Types: Cigarettes  . Smokeless tobacco: Never Used  . Alcohol Use: Yes    Review of Systems A complete 10 system review of systems was obtained and all systems are negative except as noted in the HPI and PMH.   Allergies  Review of patient's allergies indicates no known allergies.  Home Medications   Prior to Admission medications   Not on File   BP 143/87 mmHg  Pulse 102  Temp(Src) 98.7 F (37.1 C) (Oral)  Resp 16  Ht 6\' 2"  (1.88 m)  Wt 221 lb 6 oz (100.415 kg)  BMI  28.41 kg/m2  SpO2 98% Physical Exam  Constitutional: He is oriented to person, place, and time. He appears well-developed and well-nourished.  HENT:  Head: Normocephalic and atraumatic.  Eyes: EOM are normal.  Neck: Normal range of motion.  Cardiovascular: Normal rate, regular rhythm, normal heart sounds and intact distal pulses.   Pulmonary/Chest: Effort normal and breath sounds normal. No respiratory distress.  Abdominal: Soft. He exhibits no distension. There is no tenderness.  Musculoskeletal: Normal range of motion.  Neurological: He is alert and oriented to person, place, and time.  Skin: Skin is warm and dry.  Psychiatric: He has a normal mood and affect. Judgment normal.  Nursing note and vitals reviewed.  ED Course  Procedures  DIAGNOSTIC STUDIES: Oxygen Saturation is 98% on RA, normal by my interpretation.    COORDINATION OF CARE: 3:01 AM Discussed treatment plan with pt at bedside and pt agreed to plan.  Labs Review Labs Reviewed  COMPREHENSIVE METABOLIC PANEL - Abnormal; Notable for the following:    Sodium 131 (*)    Chloride 99 (*)    Glucose, Bld 111 (*)    Calcium 8.5 (*)    ALT 16 (*)    All other components within normal limits  LIPASE, BLOOD  CBC  URINALYSIS, ROUTINE W REFLEX MICROSCOPIC (NOT AT [facility])    Imaging Review  No results found. I have personally reviewed and evaluated these lab results as part of my medical decision-making.   EKG Interpretation None     MDM   Final diagnoses:  Abdominal pain, unspecified abdominal location    Patient is without complaints at this time.  He reports his pain is resolved.  He feels much better.  No tenderness at this time.  Primary care follow-up.  Patient understands return the ER for new or worsening symptoms.  Labs without significant abnormality.  Doubt significant intra-abdominal pathology to warrant CT scan at this time.  I, Domingo Fuson M, personally performed the services described in this  documentation. All medical record entries made by the scribe were at my direction and in my presence.  I have reviewed the chart and discharge instructions and agree that the record reflects my personal performance and is accurate and complete. Ivalene Platte M.  09/06/2015. 5:35 AM.       Azalia Bilis, MD 09/06/15 [phone]

## 2019-07-06 ENCOUNTER — Other Ambulatory Visit: Payer: Self-pay

## 2019-07-06 DIAGNOSIS — Z20822 Contact with and (suspected) exposure to covid-19: Secondary | ICD-10-CM

## 2019-07-08 LAB — NOVEL CORONAVIRUS, NAA: SARS-CoV-2, NAA: NOT DETECTED

## 2019-09-23 ENCOUNTER — Other Ambulatory Visit: Payer: Self-pay

## 2019-09-23 DIAGNOSIS — Z20822 Contact with and (suspected) exposure to covid-19: Secondary | ICD-10-CM

## 2019-09-24 LAB — NOVEL CORONAVIRUS, NAA: SARS-CoV-2, NAA: DETECTED — AB
